# Patient Record
Sex: Female | Born: 1955 | Race: White | Hispanic: No | Marital: Married
Health system: Northeastern US, Academic
[De-identification: ages and names within clinical notes are randomized; demographics above are authoritative.]

---

## 2015-11-25 ENCOUNTER — Ambulatory Visit: Admitting: Ophthalmology

## 2015-11-25 LAB — HX HEM-ROUTINE
HX BASO #: 0 10*3/uL (ref 0.0–0.2)
HX BASO: 1 %
HX EOSIN #: 0.2 10*3/uL (ref 0.0–0.5)
HX EOSIN: 4 %
HX HCT: 43.9 % (ref 32.0–45.0)
HX HGB: 15 g/dL (ref 11.0–15.0)
HX IMMATURE GRANULOCYTE#: 0 10*3/uL (ref 0.0–0.1)
HX IMMATURE GRANULOCYTE: 0 %
HX LYMPH #: 1.4 10*3/uL (ref 1.0–4.0)
HX LYMPH: 37 %
HX MCH: 29.8 pg (ref 26.0–34.0)
HX MCHC: 34.2 g/dL (ref 32.0–36.0)
HX MCV: 87.3 fL (ref 80.0–98.0)
HX MONO #: 0.3 10*3/uL (ref 0.2–0.8)
HX MONO: 9 %
HX MPV: 10.2 fL (ref 9.1–11.7)
HX NEUT #: 1.8 10*3/uL (ref 1.5–7.5)
HX PLT: 261 10*3/uL (ref 150–400)
HX RBC BLOOD COUNT: 5.03 M/uL — ABNORMAL HIGH (ref 3.70–5.00)
HX RDW: 12.2 % (ref 11.5–14.5)
HX SEG NEUT: 48 %
HX WBC: 3.8 10*3/uL — ABNORMAL LOW (ref 4.0–11.0)

## 2015-11-25 LAB — HX CHEM-PANELS
HX ANION GAP: 9 (ref 5–18)
HX BLOOD UREA NITROGEN: 12 mg/dL (ref 6–24)
HX CHLORIDE (CL): 107 meq/L (ref 98–110)
HX CO2: 25 meq/L (ref 20–30)
HX CREATININE (CR): 0.91 mg/dL (ref 0.57–1.30)
HX GFR, AFRICAN AMERICAN: 80 mL/min/{1.73_m2} — ABNORMAL LOW
HX GFR, NON-AFRICAN AMERICAN: 69 mL/min/{1.73_m2} — ABNORMAL LOW
HX GLUCOSE: 100 mg/dL (ref 70–139)
HX POTASSIUM (K): 3.7 meq/L (ref 3.6–5.1)
HX SODIUM (NA): 141 meq/L (ref 135–145)

## 2015-11-25 LAB — HX CHEM-OTHER
HX CALCIUM (CA): 9.3 mg/dL (ref 8.5–10.5)
HX MAGNESIUM: 2.1 mg/dL (ref 1.6–2.6)
HX PHOSPHORUS: 3.7 mg/dL (ref 2.7–4.5)

## 2015-11-25 LAB — HX DIABETES: HX GLUCOSE: 100 mg/dL (ref 70–139)

## 2015-11-25 NOTE — ED Provider Notes (Signed)
Marland Kitchen  Name: [name] [name] [name] [name]  MRN: [id number]  Age: 60 yrs  Sex: Female  DOB: 1956/04/12  Arrival Date: 11/25/2015  Arrival Time: 21:46  Account#: [id number]  .  Working Diagnosis: Syncope and collapse  PCP:  .  HPI:  06/20  22:19 This 60 yrs old White Female presents to ER via EMS with        sc24        complaints of Syncope.  22:19 This is a 60 year old female with no significant medical        sc24        history presents the ER via EMS for evaluation of syncope.        Patient was just traveled from [place] earlier today to attend        a reception, which required her to wake up at at 2AM for a        flight. Her husband is present with her stating that they have        been on the boat standing for 1-2 hours for this reception.        Approximately 1hr ago (2100) she started feeling lightheaded.        Symptoms improved with sitting and were best when she was moved        to the deck and she was laying down. They did try to stand her        multiple times with worsening of her lightheadedness. Upon        talking she had stated that "felt increasingly lightheaded and        had a syncopal episode. There were people there who guided her        to the ground, but upon sitting she did fall backwards and        struck her head gently on the dock. Patient immediately        regained consciousness, was slightly more lethargic for        approximately 20-30 minutes. There was no prodromal chest pain,        headache, visual change, shortness of breath. Did have one        alcoholic beverage today, denies recreational drug use. No        cardiac history. She currently denies any symptoms or pain..  .  Historical:  - Allergies: Keflex;  - Home Meds: None;  - PMHx: None;  - PSHx: None;  - Social history: Smoking status: Patient states was never    smoker of tobacco. The patient lives with spouse, Visiting    from out of town.  - The history from nurses notes was reviewed: and I agree with    what is  documented.  .  .  OB/GYN:  21:48 LMP N/A - Post-menopause                                        rc5  .  Vital Signs:  21:48 BP 107 / 68; Pulse 68; Resp 16; Temp 36.3; Pulse Ox 100% on     rc5        R/A; Pain 4/10;  .  Name:[name] [name]  [id number]  [id number]  Page 1 of 4  %%PAGE  .  Name: [name], [name]  MRN: [id number]  Age: 58 yrs  Sex: Female  DOB: 1955-07-26  Arrival Date: 11/25/2015  Arrival Time: 21:46  Account#: [id number]  .  Working Diagnosis: Syncope and collapse  PCP:  .  22:20 BP 106 / 56; Pulse 70; Resp 13; Pulse Ox 98% on R/A; Pain 0/10; cb8  23:15 BP 109 / 60; Pulse 68; Resp 14; Pain 0/10;                      bm1  .  MDM:  23:00 Differential Diagnosis: cardiac arrhythmia, idiopathic syncope, fdf        transient ischemic attack, vasovagal episode. Data reviewed:        vital signs, nurses notes, lab test result(s), EKG. Data        interpreted: Cardiac monitor: rhythm is normal sinus rhythm,        with no ectopy, Interpretation: normal rate, normal rhythm,        Pulse oximetry: Interpretation: normal. Counseling: I had a        detailed discussion with the patient and/or guardian regarding:        the historical points, exam findings, and any diagnostic        results supporting the discharge diagnosis, need for followup,        to return to the emergency department if symptoms worsen or        persist or if there are any questions or concerns that arise at        home. Response to treatment: Improved.  Marland Kitchen  06/20  22:10 Order name: BUN (Blood Urea Nitrogen); Complete Time: 22:51     sc24  06/20  22:10 Order name: CBC/Diff (With Plt); Complete Time: 22:51           sc24  06/20  22:10 Order name: CR (Creatinine); Complete Time: 22:51               sc24  06/20  22:10 Order name: GLU (Glucose); Complete Time: 22:51                 sc24  06/20  22:10 Order name: LYTES (Na, K, Cl, Co2); Complete Time: 22:51        sc24  06/20  22:10 Order name: Ca (Calcium); Complete Time: 22:51                   sc24  06/20  21:51 Order name: AccuCheck(POC); Complete Time: 22:08                rc5  06/20  21:51 Order name: Adult EKG (order using folder); Complete Time: 22:19rc5  06/20  22:10 Order name: Mg (Magnesium); Complete Time: 22:51                sc24  06/20  22:10 Order name: Phos (Phosphorus); Complete Time: 22:51             sc24  06/20  22:47 Order name: GFR, NAA; Complete Time: 22:51                      dispa  t  06/20  22:47 Order name: GFR, AA; Complete Time: 22:51                       dispa  t  06/20  .  Name:[name] [name]  [id number]  [id number]  Page 2 of 4  %%PAGE  .  Name: [name], [name]  MRN: [id number]  Age: 52 yrs  Sex: Female  DOB: 1956/05/24  Arrival Date: 11/25/2015  Arrival Time: 21:46  Account#: [id number]  .  Working Diagnosis: Syncope and collapse  PCP:  .  21:51 Order name: EKG (order using folder); Complete Time: 22:08      rc5  06/20  22:10 Order name: Urine Dip (POC)                                     sc24  .  Dispensed Medications:  22:16 Drug: NS - Sodium Chloride 0.9% IV ml 1000 mL Route: IV; Rate:  rc5        Bolus;  22:53 Follow up: IV Status: Completed infusion; IV Intake:     cb8  22:37 Not Given (change in plan): NS - Sodium Chloride 0.9% IV ml     cb8        1000 mL IV at Bolus once  .  Marland Kitchen  Attending Notes:  23:00 Attestation: Assessment and care plan reviewed with             fdf        resident/midlevel provider. See their note for details.        Physician Assistant's history reviewed, patient interviewed and        examined. Attending HPI: Patient presents by paramedic        ambulance after an apparent syncopal episode. She reported        feeling very lightheaded, while outside in the warm sunny        weather, and had also had half a glass of wine. She tried        sitting down, but still had any loss of consciousness, falling        backwards and bumping the back of her head mildly. There was no        seizure-like activity. No vomiting. She denies any  preceding or        following chest pain, palpitations, headache, focal neurologic        symptoms, GI symptoms. She has had a similar episode once        previously in the remote past. She is visiting from out of        state but otherwise fairly healthy. She does note that her        blood pressure tends to run low. She had only eaten lightly        beforehand. She currently feels back to her normal self.        Attending Exam: My personal exam reveals Patient appears in no        acute distress. Her vital signs are notable only for a low        normal blood pressure. Skin is without diaphoresis. NC/AT. Neck        is supple with full range of motion. Lungs entirely clear        bilaterally with good breath sounds in no respiratory distress.        Cardiac exam is regular with no extra heart sounds and pulses        are strong. Abdomen is soft and nontender no appreciable        masses. Extremities without injury. Nonfocal neuro exam        clinically oriented with clear speech cranial nerves grossly        symmetric, sensory and motor exam is grossly intact, speech  clear, fully oriented. Skin without abrasion, ecchymosis,        laceration. I have reviewed the Nurses Notes, and I agree.        Lab/Ancillary show: All Normal. ED Course: Patient remained in  .  Name:[name] [name]  [id number]  [id number]  Page 3 of 4  %%PAGE  .  Name: [name], [name]  MRN: [id number]  Age: 20 yrs  Sex: Female  DOB: [birth year]  Arrival Date: 11/25/2015  Arrival Time: 21:46  Account#: [id number]  .  Working Diagnosis: Syncope and collapse  PCP:  .        normal sinus rhythm on the cardiac monitor, not demonstrating        any ectopy or arrhythmia. She feels much better after IV        hydration. She feels well enough to be discharged. She is urged        to followup with her PCP when she returns home. My Working        Impression: Vasovagal syncope. Attending chart complete and        electronically signed: Sharrell Ku.  Zachery Conch MD, MS, Armando Gang        [phone].  Marland Kitchen  Disposition:  23:06 Chart complete.                                                 fdf  .  Disposition Summary:  23:17 11/25/2015 23:05 Discharged to Home. Impression: Syncope and    fdf        collapse. Condition is Stable. Discharge Instructions: SYNCOPE,        Vasovagal. Follow up: Private Physician; When: Next week;        Reason: Continuance of care. Problem is new. Symptoms have        improved.  .  Signatures:  Lorraine Lax                      MD   fdf  Dispatcher, [employer]                          dispa  Rudell Cobb                        RN   bm1  Jonathon Jordan                       RN   cb8  [place], [place]                           [place]   sc24  Cassandria Santee                          RN   rc5  .  Document is preliminary until electronically or manually signed by the atte  nding physician  .  .  .  .  .  .  .  .  .  .  .  .  .  .  .  .  Name:[name], [name]  [id number]  [id number]  Page 4 of 4  .  %%END

## 2015-11-25 NOTE — ED Provider Notes (Signed)
Marland Kitchen  Name: [name] [name], [name] [name]  MRN: [id number]  Age: 60 yrs  Sex: Female  DOB: [birth year]  Arrival Date: 11/25/2015  Arrival Time: 21:46  Account#: [id number]  Bed A3  PCP:  Chief Complaint: Syncope  .  Presentation:  06/20  21:46 Presenting complaint: EMS states: c/o syncope while on boat     rc5        today; felt dizzy then fell to floor; positive head strike; no        c-spine tenderness; having left leg pain no obvious deformity;        currently A/Ox4 answering questions appropriately. Acute        neurological deficits are not present. Patient denies the        presence of acute pain prior to syncopal episode. The patient        is not diaphoretic or experienced diaphoresis prior to the        syncopal episode The patient denies shortness of breath. The        patient denies recent surgery.  21:46 Method Of Arrival: EMS: [facility]                              rc5  21:46 Acuity: Adult 3                                                 rc5  .  Historical:  - Allergies:  21:48 Keflex;                                                         rc5  - Home Meds:  21:48 None [Active];                                                  rc5  - PMHx:  21:48 None;                                                           rc5  - PSHx:  21:48 None;                                                           rc5  .  - Social history: Smoking status: Patient states was never    smoker of tobacco. The patient lives with spouse, Visiting    from out of town.  - The history from nurses notes was reviewed: and I agree with    what is documented.  .  .  Screening:  21:48 SEPSIS SCREENING - Temp > 38.3 or < 36.0 No -  Heart Rate > 90   rc5        No - Respiratory > 20 No - SBP < 90 No Does this patient have a        suspected source of infection at this timequestion No SIRS Criteria (>        = 2) No. Safety screen: Patient feels safe. Suicide Screening:        Patients presentation: No risk factors Patient denies thoughts        of harm.  Nutritional screening: No deficits noted. Tuberculosis        screening: No symptoms or risk factors identified. Fall Risk        Fall in past 12 months (25 points). No secondary diagnosis (0        pts). IV access (20 points). Ambulatory Aid- None/Bed        Rest/Nurse Assist (0 pts). Gait- Weak (10 pts.). Mental Status-  .  Name:[name] [name], [name] [name]  [id number]  [id number]  Page 1 of 4  %%PAGE  .  Name: [name] [name], [name] [name]  MRN: [id number]  Age: 89 yrs  Sex: Female  DOB: [birth year]  Arrival Date: 11/25/2015  Arrival Time: 21:46  Account#: [id number]  Bed A3  PCP:  Chief Complaint: Syncope  .        Oriented to own ability (0 pts). Total Morse Fall Scale        indicates High Risk Score (50 or more points). Side Rails Up X        2 Placed Close to Nursing Station Frequent Obs/Assessments        Occuring As available patient and family educated on [employer] and Strategies. Exposure Risk/Travel        Screening: None identified. Has not been out of the country.  .  Vital Signs:  21:48 BP 107 / 68; Pulse 68; Resp 16; Temp 36.3; Pulse Ox 100% on     rc5        R/A; Pain 4/10;  22:20 BP 106 / 56; Pulse 70; Resp 13; Pulse Ox 98% on R/A; Pain 0/10; cb8  23:15 BP 109 / 60; Pulse 68; Resp 14; Pain 0/10;                      bm1  .  OB/GYN:  21:48 LMP N/A - Post-menopause                                        rc5  .  Triage Assessment:  21:48 General: Appears in no apparent distress, comfortable, Behavior rc5        is cooperative. Pain: Complains of pain in left leg. Neuro:        Level of Consciousness is awake, alert, Oriented to person,        place, time, Reports dizziness. Cardiovascular: Capillary        refill < 3 seconds Heart tones present Chest pain is denied.        Respiratory: Airway is patent Respiratory effort is even,        unlabored, Respiratory pattern is regular, symmetrical. Skin:        Skin is intact, Skin is normal.  .  Assessment:  22:21 Reassessment: Pt reports syncope from  sitting position while on cb8  boat. Pt felt warm, states was under a canopy and there was not        a lot of air moving. Also had one glass of wine on empty        stomach. Reports feels better now. Abrasion noted to left shin.        Denies head/neck pain.  .  Observations:  21:46 Patient arrived in ED.                                          rc5  21:48 Triage Completed.                                               rc5  21:53 Patient Visited By: Renne Musca                               sc24  22:20 Patient Visited By: Jonathon Jordan                           cb8  22:24 Patient Visited By: Lorraine Lax                          fdf  22:50 Patient Visited By: Lorraine Lax                          fdf  23:05 Patient Visited By: Lorraine Lax                          fdf  23:15 Patient Visited By: Fredna Dow  Name:[name] [name], [name] [name]  [id number]  [id number]  Page 2 of 4  %%PAGE  .  Name: [name] [name], [name] [name]  MRN: [id number]  Age: 42 yrs  Sex: Female  DOB: 03-26-1956  Arrival Date: 11/25/2015  Arrival Time: 21:46  Account#: [id number]  Bed A3  PCP:  Chief Complaint: Syncope  .  23:42 Registration completed.                                         mj11  .  Procedure:  22:08 Inserted peripheral IV: 18 gauge in right antecubital area.     rc5  22:19 Ca (Calcium) Sent.                                              rc5  22:19 Mg (Magnesium) Sent.                                            rc5  22:20 Phos (Phosphorus) Sent.  rc5  22:20 BUN (Blood Urea Nitrogen) Sent.                                 rc5  22:20 CBC/Diff (With Plt) Sent.                                       rc5  22:20 CR (Creatinine) Sent.                                           rc5  22:20 GLU (Glucose) Sent.                                             rc5  22:20 LYTES (Na, K, Cl, Co2) Sent.                                    rc5  22:39 EKG done. (by ED  staff). No Old EKG Reviewed By: Clearance Coots        MD.  23:15 Discontinued lock intact, bleeding controlled, pressure         bm1        dressing applied, No redness/swelling at site.  .  Dispensed Medications:  22:16 Drug: NS - Sodium Chloride 0.9% IV ml 1000 mL Route: IV; Rate:  rc5        Bolus;  22:53 Follow up: IV Status: Completed infusion; IV Intake:     cb8  22:37 Not Given (change in plan): NS - Sodium Chloride 0.9% IV ml     cb8        1000 mL IV at Bolus once  .  Marland Kitchen  Intake:  22:53 IV: 1000.27ml; Total: 1000.56ml.                                cb8  .  Interventions:  22:05 POC Test Accucheck POC is 102 RN notified of POC results.       cm35  22:24 Diet: Patient given water.                                      cb8  22:42 EMS Sheet Scanned into Chart                                    sh2  .  Outcome:  23:05 Discharge ordered by MD.                                        fdf  23:15 Discharged to home ambulatory. Condition: good. Discharge       bm1        instructions given to patient, Instructed on discharge  instructions, follow up and referral plans. Demonstrated        understanding of instructions. Discharge Assessment: Patient        awake, alert and oriented x 3. No cognitive and/or functional        deficits noted. Patient verbalized understanding of disposition        instructions. Chart Status Nursing note complete and  .  Name:[name] [name], [name] [name]  [id number]  [id number]  Page 3 of 4  %%PAGE  .  Name: [name] [name], [name] [name]  MRN: [id number]  Age: 89 yrs  Sex: Female  DOB: May 27, 1956  Arrival Date: 11/25/2015  Arrival Time: 21:46  Account#: [id number]  Bed A3  PCP:  Chief Complaint: Syncope  .        electronically signed.  23:17 Patient left the ED.                                            bm1  .  Signatures:  Lorraine Lax                      MD   fdf  [employer]                        RN   bm1  Jonathon Jordan                        RN   cb8  [employer], [place]ian[place]                          mj11  [place], [place]                         CCT  cm35  [place], [place]                           [place]   sc24  Cassandria Santee                          RN   rc5  [place]                       CCT  [id number]  .  .  .  .  .  .  .  .  .  .  .  .  .  .  .  .  .  .  .  .  .  .  .  .  .  .  .  .  .  .  Name:[name] [name], [name] [name]  [id number]  [id number]  Page 4 of 4  .  %%END
